# Patient Record
Sex: Male | Born: 2000 | Race: Black or African American | Hispanic: No | Marital: Single | Smoking: Never smoker
Health system: Southern US, Academic
[De-identification: ages and names within clinical notes are randomized; demographics above are authoritative.]

## PROBLEM LIST (undated history)

## (undated) DIAGNOSIS — F909 Attention-deficit hyperactivity disorder, unspecified type: Secondary | ICD-10-CM

## (undated) HISTORY — PX: FOOT SURGERY: SHX648

## (undated) HISTORY — PX: HIP SURGERY: SHX245

## (undated) HISTORY — PX: CLUB FOOT RELEASE: SHX1363

## (undated) HISTORY — PX: HX HIP SURGERY: 2100001310

## (undated) HISTORY — DX: Attention-deficit hyperactivity disorder, unspecified type: F90.9

## (undated) HISTORY — PX: HX FOOT SURGERY: 2100001154

## (undated) HISTORY — PX: HX OTHER: 2100001105

---
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
## 2019-08-13 ENCOUNTER — Emergency Department (EMERGENCY_DEPARTMENT_HOSPITAL): Payer: Medicare (Managed Care)

## 2019-08-13 ENCOUNTER — Other Ambulatory Visit: Payer: Self-pay

## 2019-08-13 ENCOUNTER — Encounter (HOSPITAL_COMMUNITY): Payer: Self-pay

## 2019-08-13 ENCOUNTER — Emergency Department
Admission: EM | Admit: 2019-08-13 | Discharge: 2019-08-14 | Disposition: A | Discharge status: Home / Self Care | Payer: Medicare (Managed Care) | Attending: Emergency Medicine | Admitting: Emergency Medicine

## 2019-08-13 DIAGNOSIS — S1980XA Other specified injuries of unspecified part of neck, initial encounter: Secondary | ICD-10-CM

## 2019-08-13 DIAGNOSIS — S199XXA Unspecified injury of neck, initial encounter: Secondary | ICD-10-CM | POA: Insufficient documentation

## 2019-08-13 DIAGNOSIS — M542 Cervicalgia: Secondary | ICD-10-CM

## 2019-08-13 DIAGNOSIS — Q7649 Other congenital malformations of spine, not associated with scoliosis: Secondary | ICD-10-CM | POA: Insufficient documentation

## 2019-08-13 MED ORDER — KETOROLAC 60 MG/2 ML INTRAMUSCULAR SOLUTION
60.0000 mg | INTRAMUSCULAR | Status: AC
Start: 2019-08-14 — End: 2019-08-13
  Administered 2019-08-13: 60 mg via INTRAMUSCULAR
  Filled 2019-08-13: qty 2

## 2019-08-13 NOTE — ED Nurses Note (Signed)
Assumed care of patient from triage at this time.
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
## 2019-08-14 MED ORDER — IBUPROFEN 800 MG TABLET
800.00 mg | ORAL_TABLET | Freq: Three times a day (TID) | ORAL | 0 refills | Status: AC | PRN
Start: 2019-08-14 — End: ?

## 2019-08-14 MED ORDER — BACLOFEN 20 MG TABLET
20.00 mg | ORAL_TABLET | Freq: Two times a day (BID) | ORAL | 0 refills | Status: AC | PRN
Start: 2019-08-14 — End: ?

## 2019-08-14 MED ORDER — BACLOFEN 10 MG TABLET
20.00 mg | ORAL_TABLET | ORAL | Status: AC
Start: 2019-08-14 — End: 2019-08-14
  Administered 2019-08-14: 20 mg via ORAL
  Filled 2019-08-14: qty 2

## 2019-08-14 NOTE — ED Nurses Note (Signed)
Patient resting comfortably at this time, C-Colloar remains in place at this time  Right elbow soaking in solution.

## 2019-08-14 NOTE — ED Nurses Note (Signed)
Right elbow cleaned with peroxide and saline. Layer of bacitracin put over wound, covered with Vaseline gauze and wrapped.
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
## 2019-08-14 NOTE — ED Nurses Note (Signed)
Soft collar provided at d/c    Patient discharged home with family. AVS reviewed with patient/caregiver. A written copy of the AVS and discharge instructions was given to the patient/caregiver.  Questions sufficiently answered as needed. Patient/caregiver encouraged to follow up with PCP as indicated. In the event of an emergency, patient/caregiver instructed to call 911 or go to the nearest emergency room.    Discussed prescribed medications (2), states understanding of use, patient condition improved/stable at d/c

## 2023-02-16 ENCOUNTER — Encounter (HOSPITAL_COMMUNITY): Payer: Self-pay

## 2023-02-16 ENCOUNTER — Emergency Department
Admission: EM | Admit: 2023-02-16 | Discharge: 2023-02-16 | Disposition: A | Discharge status: Home / Self Care | Payer: Self-pay | Attending: Physician Assistant | Admitting: Physician Assistant

## 2023-02-16 ENCOUNTER — Other Ambulatory Visit: Payer: Self-pay

## 2023-02-16 DIAGNOSIS — K0889 Other specified disorders of teeth and supporting structures: Secondary | ICD-10-CM | POA: Insufficient documentation

## 2023-02-16 MED ORDER — NAPROXEN 500 MG TABLET
500.0000 mg | ORAL_TABLET | Freq: Two times a day (BID) | ORAL | 0 refills | Status: AC
Start: 2023-02-16 — End: ?

## 2023-02-16 MED ORDER — BUPIVACAINE (PF) 0.5 % (5 MG/ML) INJECTION SOLUTION
10.0000 mL | INTRAMUSCULAR | Status: AC
Start: 2023-02-16 — End: 2023-02-16
  Administered 2023-02-16: 10 mL via INTRAMUSCULAR
  Filled 2023-02-16: qty 30

## 2023-02-16 MED ORDER — AMOXICILLIN 500 MG CAPSULE
500.0000 mg | ORAL_CAPSULE | Freq: Three times a day (TID) | ORAL | 0 refills | Status: AC
Start: 2023-02-16 — End: 2023-02-26

## 2023-02-16 NOTE — ED Triage Notes (Signed)
Patient complaining of toothache on the right lower part of his mouth.  Pain started approx 2 hours prior to arrival

## 2023-02-16 NOTE — ED Nurses Note (Signed)
Patient discharged home.  AVS reviewed with patient.  A written copy of the AVS and discharge instructions was given to the patient.  Questions sufficiently answered as needed.  Patient encouraged to follow up with PCP as indicated.  In the event of an emergency, patient instructed to call 911 or go to the nearest emergency room.  Prescriptions sent electronically and follow up information given, pt verbalized understanding. Dental list given. VSS, pt ambulatory with steady gait.

## 2023-02-16 NOTE — ED Provider Notes (Signed)
[facility]  [facility]  Emergency Department     HISTORY OF PRESENT ILLNESS     Date:  02/16/2023  Patient's Name:  Mason Harding  Date of Birth:  January 22, 2001    Patient is a 22 year old male who presents emergency department for evaluation of right lower dental pain.  Patient notes he has a bad tooth in the area.  Patient notes pain started approximately 2 hours ago.  Patient does not have a dentist.  Patient denies any fevers or chills.  Patient denies any difficulty swallowing or any swelling.        Review of Systems     Review of Systems    Previous History     Past Medical History:  Past Medical History:   Diagnosis Date    ADHD (attention deficit hyperactivity disorder)        Past Surgical History:  Past Surgical History:   Procedure Laterality Date    Hx foot surgery Bilateral     Hx hip surgery Left     Hx other Bilateral     Hx other Bilateral        Social History:  Social History     Tobacco Use    Smoking status: Never    Smokeless tobacco: Never   Vaping Use    Vaping status: Some Days    Substances: THC, CBD    Devices: Disposable   Substance Use Topics    Alcohol use: Yes     Alcohol/week: 2.0 standard drinks of alcohol     Types: 2 Shots of liquor per week    Drug use: Not Currently     Types: Marijuana     Social History     Substance and Sexual Activity   Drug Use Not Currently    Types: Marijuana       Family History:  Family History   Problem Relation Age of Onset    Asthma Mother     Diabetes Mother     Hypertension (High Blood Pressure) Mother     Cancer Maternal Grandmother         unknown what type    Heart Attack Other         maternal great grandmother       Medication History:  Current Outpatient Medications   Medication Sig    amoxicillin (AMOXIL) 500 mg Oral Capsule Take 1 Capsule (500 mg total) by mouth Three times a day for 10 days    Ibuprofen (MOTRIN) 600 mg Oral Tablet Take 1 Tab (600 mg total) by mouth Four times a day as needed for Pain    Ibuprofen  (MOTRIN) 800 mg Oral Tablet Take 1 Tablet (800 mg total) by mouth Three times a day as needed for Pain    naproxen (NAPROSYN) 500 mg Oral Tablet Take 1 Tablet (500 mg total) by mouth Twice daily with food       Allergies:  No Known Allergies    Physical Exam     Vitals:    BP (!) 157/76   Pulse 62   Temp 36.7 C (98 F)   Resp 16   Ht 1.854 m (6\' 1" )   Wt 90.7 kg (200 lb)   SpO2 98%   BMI 26.39 kg/m     Physical Exam  Constitutional:       Appearance: Normal appearance.   HENT:      Head: Normocephalic.      Nose: Nose normal.  Mouth/Throat:      Comments: Right back molar with large carie  Pulmonary:      Effort: Pulmonary effort is normal.      Breath sounds: Normal breath sounds.   Musculoskeletal:         General: Normal range of motion.   Lymphadenopathy:      Cervical: No cervical adenopathy.   Skin:     General: Skin is warm.      Capillary Refill: Capillary refill takes less than 2 seconds.   Neurological:      General: No focal deficit present.      Mental Status: He is alert and oriented to person, place, and time.         Diagnostic Studies/Treatment     Medications:  Medications Administered in the ED   BUPivacaine PF (MARCAINE) 0.5% injection (has no administration in time range)       New Prescriptions    AMOXICILLIN (AMOXIL) 500 MG ORAL CAPSULE    Take 1 Capsule (500 mg total) by mouth Three times a day for 10 days    NAPROXEN (NAPROSYN) 500 MG ORAL TABLET    Take 1 Tablet (500 mg total) by mouth Twice daily with food       Labs:    No results found for any visits on 02/16/23.    Radiology:  None    No orders to display       ECG:  NONE      Procedure     Procedures    Right inferior alveolar nerve block.  3 mL of 0.5% bupivacaine injected the area of the right inferior alveolar nerve.  Negative aspiration of blood prior to injecting.  Procedure well tolerated.  Total regional anesthesia was achieved.    Course/Disposition/Plan     Course:  On initial evaluation patient no acute distress.   Patient had dental block in the emergency department with complete resolution of pain.  We had patient should follow-up with dentist as soon as possible.        Disposition:    Discharged    Condition at Disposition:   Stable      Follow up:   Any Dentist            Clinical Impression:     Clinical Impression   Pain, dental (Primary)       Future Appointments Scheduled in Epic:  No future appointments.
